# Patient Record
Sex: Female | Born: 1970
Health system: Southern US, Community
[De-identification: ages and names within clinical notes are randomized; demographics above are authoritative.]

## PROBLEM LIST (undated history)

## (undated) DIAGNOSIS — G4730 Sleep apnea, unspecified: Secondary | ICD-10-CM

## (undated) HISTORY — PX: CHOLECYSTECTOMY: SHX55

## (undated) HISTORY — DX: Sleep apnea, unspecified: G47.30

---

## 1998-03-26 ENCOUNTER — Other Ambulatory Visit: Admission: RE | Admit: 1998-03-26 | Discharge: 1998-03-26 | Payer: Self-pay | Admitting: Obstetrics and Gynecology

## 1998-10-17 ENCOUNTER — Inpatient Hospital Stay (HOSPITAL_COMMUNITY): Admission: AD | Admit: 1998-10-17 | Discharge: 1998-10-19 | Payer: Self-pay | Admitting: Obstetrics and Gynecology

## 1998-12-02 ENCOUNTER — Other Ambulatory Visit: Admission: RE | Admit: 1998-12-02 | Discharge: 1998-12-02 | Payer: Self-pay | Admitting: Obstetrics and Gynecology

## 1999-12-30 ENCOUNTER — Other Ambulatory Visit: Admission: RE | Admit: 1999-12-30 | Discharge: 1999-12-30 | Payer: Self-pay | Admitting: Obstetrics and Gynecology

## 2000-06-21 ENCOUNTER — Inpatient Hospital Stay (HOSPITAL_COMMUNITY): Admission: AD | Admit: 2000-06-21 | Discharge: 2000-06-21 | Payer: Self-pay | Admitting: Obstetrics and Gynecology

## 2000-07-15 ENCOUNTER — Inpatient Hospital Stay (HOSPITAL_COMMUNITY): Admission: AD | Admit: 2000-07-15 | Discharge: 2000-07-16 | Payer: Self-pay | Admitting: Obstetrics and Gynecology

## 2000-09-02 ENCOUNTER — Other Ambulatory Visit: Admission: RE | Admit: 2000-09-02 | Discharge: 2000-09-02 | Payer: Self-pay | Admitting: Obstetrics and Gynecology

## 2000-11-19 ENCOUNTER — Emergency Department (HOSPITAL_COMMUNITY): Admission: EM | Admit: 2000-11-19 | Discharge: 2000-11-19 | Payer: Self-pay | Admitting: Emergency Medicine

## 2000-11-30 ENCOUNTER — Ambulatory Visit (HOSPITAL_COMMUNITY): Admission: RE | Admit: 2000-11-30 | Discharge: 2000-11-30 | Payer: Self-pay | Admitting: Pulmonary Disease

## 2001-12-08 ENCOUNTER — Other Ambulatory Visit: Admission: RE | Admit: 2001-12-08 | Discharge: 2001-12-08 | Payer: Self-pay | Admitting: Obstetrics and Gynecology

## 2001-12-13 ENCOUNTER — Encounter: Admission: RE | Admit: 2001-12-13 | Discharge: 2001-12-13 | Payer: Self-pay | Admitting: Obstetrics and Gynecology

## 2001-12-13 ENCOUNTER — Encounter: Payer: Self-pay | Admitting: Obstetrics and Gynecology

## 2007-11-14 ENCOUNTER — Encounter: Admission: RE | Admit: 2007-11-14 | Discharge: 2007-11-14 | Payer: Self-pay | Admitting: Pulmonary Disease

## 2013-06-08 ENCOUNTER — Other Ambulatory Visit (HOSPITAL_COMMUNITY): Payer: Self-pay | Admitting: Internal Medicine

## 2013-06-08 DIAGNOSIS — Z139 Encounter for screening, unspecified: Secondary | ICD-10-CM

## 2013-06-15 ENCOUNTER — Ambulatory Visit (HOSPITAL_COMMUNITY)
Admission: RE | Admit: 2013-06-15 | Discharge: 2013-06-15 | Disposition: A | Discharge status: Home / Self Care | Payer: 59 | Source: Ambulatory Visit | Attending: Internal Medicine | Admitting: Internal Medicine

## 2013-06-15 DIAGNOSIS — Z1231 Encounter for screening mammogram for malignant neoplasm of breast: Secondary | ICD-10-CM | POA: Insufficient documentation

## 2013-06-15 DIAGNOSIS — Z139 Encounter for screening, unspecified: Secondary | ICD-10-CM

## 2014-05-31 ENCOUNTER — Other Ambulatory Visit: Payer: Self-pay

## 2014-05-31 DIAGNOSIS — Z1231 Encounter for screening mammogram for malignant neoplasm of breast: Secondary | ICD-10-CM

## 2014-06-21 ENCOUNTER — Ambulatory Visit
Admission: RE | Admit: 2014-06-21 | Discharge: 2014-06-21 | Disposition: A | Discharge status: Home / Self Care | Payer: 59 | Source: Ambulatory Visit

## 2014-06-21 ENCOUNTER — Encounter (INDEPENDENT_AMBULATORY_CARE_PROVIDER_SITE_OTHER): Payer: Self-pay

## 2014-06-21 DIAGNOSIS — Z1231 Encounter for screening mammogram for malignant neoplasm of breast: Secondary | ICD-10-CM

## 2015-05-14 ENCOUNTER — Encounter: Payer: Self-pay | Admitting: Family

## 2015-05-14 ENCOUNTER — Ambulatory Visit (INDEPENDENT_AMBULATORY_CARE_PROVIDER_SITE_OTHER): Payer: 59 | Admitting: Family

## 2015-05-14 VITALS — BP 128/76 | HR 81 | Temp 97.1°F | Ht 69.5 in | Wt 315.0 lb

## 2015-05-14 DIAGNOSIS — J0100 Acute maxillary sinusitis, unspecified: Secondary | ICD-10-CM | POA: Diagnosis not present

## 2015-05-14 MED ORDER — AMOXICILLIN-POT CLAVULANATE 875-125 MG PO TABS: 1.0000 | ORAL_TABLET | Freq: Two times a day (BID) | ORAL | Status: AC

## 2015-05-14 MED ORDER — FLUTICASONE PROPIONATE 50 MCG/ACT NA SUSP: 2.0000 | Freq: Every day | NASAL | Status: AC

## 2015-05-14 NOTE — Progress Notes (Signed)
Subjective:    Patient ID: Nicole Hatfield, female    DOB: 1970-07-31, 44 y.o.   MRN: [id number]  PT presents to the office today establish care with sinus pain/infection.  Cough The current episode started in the past 7 days. The problem has been waxing and waning. The problem occurs every few minutes. The cough is productive of purulent sputum. Associated symptoms include ear pain (left) and a sore throat. Pertinent negatives include no chills, headaches or shortness of breath. She has tried rest and OTC cough suppressant for the symptoms.  Sinus Problem This is a recurrent problem. The current episode started 1 to 4 weeks ago. The problem has been waxing and waning since onset. There has been no fever. Her pain is at a severity of 2/10. The pain is mild. Associated symptoms include congestion, coughing, ear pain (left), a hoarse voice, sinus pressure, sneezing and a sore throat. Pertinent negatives include no chills, headaches or shortness of breath. Past treatments include oral decongestants, lying down and acetaminophen. The treatment provided mild relief.      Review of Systems  Constitutional: Negative.  Negative for chills.  HENT: Positive for congestion, ear pain (left), hoarse voice, sinus pressure, sneezing and sore throat.   Eyes: Negative.   Respiratory: Positive for cough. Negative for shortness of breath.   Cardiovascular: Negative.  Negative for palpitations.  Gastrointestinal: Negative.   Endocrine: Negative.   Genitourinary: Negative.   Musculoskeletal: Negative.   Neurological: Negative.  Negative for headaches.  Hematological: Negative.   Psychiatric/Behavioral: Negative.   All other systems reviewed and are negative.      Objective:   Physical Exam  Constitutional: She is oriented to person, place, and time. She appears well-developed and well-nourished. No distress.  HENT:  Head: Normocephalic and atraumatic.  Right Ear: External ear normal.  Left Ear:  External ear normal.  Nose: Right sinus exhibits maxillary sinus tenderness. Left sinus exhibits maxillary sinus tenderness.  Nasal passage erythemas with mild swelling  Oropharynx erytheams  Eyes: Pupils are equal, round, and reactive to light.  Neck: Normal range of motion. Neck supple. No thyromegaly present.  Cardiovascular: Normal rate, regular rhythm, normal heart sounds and intact distal pulses.   No murmur heard. Pulmonary/Chest: Effort normal and breath sounds normal. No respiratory distress. She has no wheezes.  Abdominal: Soft. Bowel sounds are normal. She exhibits no distension. There is no tenderness.  Musculoskeletal: Normal range of motion. She exhibits no edema or tenderness.  Neurological: She is alert and oriented to person, place, and time. She has normal reflexes. No cranial nerve deficit.  Skin: Skin is warm and dry.  Psychiatric: She has a normal mood and affect. Her behavior is normal. Judgment and thought content normal.  Vitals reviewed.     BP 128/76 mmHg  Pulse 81  Temp(Src) 97.1 F (36.2 C) (Oral)  Ht 5' 9.5" (1.765 m)  Wt 315 lb (142.883 kg)  BMI 45.87 kg/m2     Assessment & Plan:  1. Acute maxillary sinusitis, recurrence not specified -- Take meds as prescribed - Use a cool mist humidifier  -Use saline nose sprays frequently -Saline irrigations of the nose can be very helpful if done frequently.  * 4X daily for 1 week*  * Use of a nettie pot can be helpful with this. Follow directions with this* -Force fluids -For any cough or congestion  Use plain Mucinex- regular strength or max strength is fine   * Children- consult with Pharmacist for dosing -  For fever or aces or pains- take tylenol or ibuprofen appropriate for age and weight.  * for fevers greater than 101 orally you may alternate ibuprofen and tylenol every  3 hours. -Throat lozenges if help - amoxicillin-clavulanate (AUGMENTIN) 875-125 MG tablet; Take 1 tablet by mouth 2 (two) times  daily.  Dispense: 14 tablet; Refill: 0  Jannifer Rodneyhristy Araminta Zorn, FNP

## 2015-05-14 NOTE — Patient Instructions (Signed)
Sinusitis, Adult Sinusitis is redness, soreness, and inflammation of the paranasal sinuses. Paranasal sinuses are air pockets within the bones of your face. They are located beneath your eyes, in the middle of your forehead, and above your eyes. In healthy paranasal sinuses, mucus is able to drain out, and air is able to circulate through them by way of your nose. However, when your paranasal sinuses are inflamed, mucus and air can become trapped. This can allow bacteria and other germs to grow and cause infection. Sinusitis can develop quickly and last only a short time (acute) or continue over a long period (chronic). Sinusitis that lasts for more than 12 weeks is considered chronic. CAUSES Causes of sinusitis include:  Allergies.  Structural abnormalities, such as displacement of the cartilage that separates your nostrils (deviated septum), which can decrease the air flow through your nose and sinuses and affect sinus drainage.  Functional abnormalities, such as when the small hairs (cilia) that line your sinuses and help remove mucus do not work properly or are not present. SIGNS AND SYMPTOMS Symptoms of acute and chronic sinusitis are the same. The primary symptoms are pain and pressure around the affected sinuses. Other symptoms include:  Upper toothache.  Earache.  Headache.  Bad breath.  Decreased sense of smell and taste.  A cough, which worsens when you are lying flat.  Fatigue.  Fever.  Thick drainage from your nose, which often is green and may contain pus (purulent).  Swelling and warmth over the affected sinuses. DIAGNOSIS Your health care provider will perform a physical exam. During your exam, your health care provider may perform any of the following to help determine if you have acute sinusitis or chronic sinusitis:  Look in your nose for signs of abnormal growths in your nostrils (nasal polyps).  Tap over the affected sinus to check for signs of  infection.  View the inside of your sinuses using an imaging device that has a light attached (endoscope). If your health care provider suspects that you have chronic sinusitis, one or more of the following tests may be recommended:  Allergy tests.  Nasal culture. A sample of mucus is taken from your nose, sent to a lab, and screened for bacteria.  Nasal cytology. A sample of mucus is taken from your nose and examined by your health care provider to determine if your sinusitis is related to an allergy. TREATMENT Most cases of acute sinusitis are related to a viral infection and will resolve on their own within 10 days. Sometimes, medicines are prescribed to help relieve symptoms of both acute and chronic sinusitis. These may include pain medicines, decongestants, nasal steroid sprays, or saline sprays. However, for sinusitis related to a bacterial infection, your health care provider will prescribe antibiotic medicines. These are medicines that will help kill the bacteria causing the infection. Rarely, sinusitis is caused by a fungal infection. In these cases, your health care provider will prescribe antifungal medicine. For some cases of chronic sinusitis, surgery is needed. Generally, these are cases in which sinusitis recurs more than 3 times per year, despite other treatments. HOME CARE INSTRUCTIONS  Drink plenty of water. Water helps thin the mucus so your sinuses can drain more easily.  Use a humidifier.  Inhale steam 3-4 times a day (for example, sit in the bathroom with the shower running).  Apply a warm, moist washcloth to your face 3-4 times a day, or as directed by your health care provider.  Use saline nasal sprays to help   moisten and clean your sinuses.  Take medicines only as directed by your health care provider.  If you were prescribed either an antibiotic or antifungal medicine, finish it all even if you start to feel better. SEEK IMMEDIATE MEDICAL CARE IF:  You have  increasing pain or severe headaches.  You have nausea, vomiting, or drowsiness.  You have swelling around your face.  You have vision problems.  You have a stiff neck.  You have difficulty breathing.   This information is not intended to replace advice given to you by your health care provider. Make sure you discuss any questions you have with your health care provider.   Document Released: 05/04/2005 Document Revised: 05/25/2014 Document Reviewed: 05/19/2011 [employer] Interactive Patient Education 2016 [employer]  - Take meds as prescribed - Use a cool mist humidifier  -Use saline nose sprays frequently -Saline irrigations of the nose can be very helpful if done frequently.  * 4X daily for 1 week*  * Use of a nettie pot can be helpful with this. Follow directions with this* -Force fluids -For any cough or congestion  Use plain Mucinex- regular strength or max strength is fine   * Children- consult with Pharmacist for dosing -For fever or aces or pains- take tylenol or ibuprofen appropriate for age and weight.  * for fevers greater than 101 orally you may alternate ibuprofen and tylenol every  3 hours. -Throat lozenges if help   Jahleah Mariscal, FNP   

## 2019-04-10 ENCOUNTER — Other Ambulatory Visit: Payer: Self-pay | Admitting: Obstetrics and Gynecology

## 2019-04-10 DIAGNOSIS — R928 Other abnormal and inconclusive findings on diagnostic imaging of breast: Secondary | ICD-10-CM

## 2019-04-19 ENCOUNTER — Ambulatory Visit
Admission: RE | Admit: 2019-04-19 | Discharge: 2019-04-19 | Disposition: A | Discharge status: Home / Self Care | Payer: 59 | Source: Ambulatory Visit | Attending: Obstetrics and Gynecology | Admitting: Obstetrics and Gynecology

## 2019-04-19 ENCOUNTER — Other Ambulatory Visit: Payer: Self-pay | Admitting: Obstetrics and Gynecology

## 2019-04-19 ENCOUNTER — Other Ambulatory Visit: Payer: Self-pay

## 2019-04-19 DIAGNOSIS — N6489 Other specified disorders of breast: Secondary | ICD-10-CM

## 2019-04-19 DIAGNOSIS — R928 Other abnormal and inconclusive findings on diagnostic imaging of breast: Secondary | ICD-10-CM

## 2019-10-20 ENCOUNTER — Other Ambulatory Visit: Payer: Self-pay | Admitting: Obstetrics and Gynecology

## 2019-10-20 ENCOUNTER — Ambulatory Visit
Admission: RE | Admit: 2019-10-20 | Discharge: 2019-10-20 | Disposition: A | Discharge status: Home / Self Care | Payer: 59 | Source: Ambulatory Visit | Attending: Obstetrics and Gynecology | Admitting: Obstetrics and Gynecology

## 2019-10-20 ENCOUNTER — Other Ambulatory Visit: Payer: Self-pay

## 2019-10-20 DIAGNOSIS — N6489 Other specified disorders of breast: Secondary | ICD-10-CM

## 2020-04-22 ENCOUNTER — Ambulatory Visit
Admission: RE | Admit: 2020-04-22 | Discharge: 2020-04-22 | Disposition: A | Discharge status: Home / Self Care | Payer: 59 | Source: Ambulatory Visit | Attending: Obstetrics and Gynecology | Admitting: Obstetrics and Gynecology

## 2020-04-22 ENCOUNTER — Other Ambulatory Visit: Payer: Self-pay

## 2020-04-22 DIAGNOSIS — N6489 Other specified disorders of breast: Secondary | ICD-10-CM

## 2021-03-26 ENCOUNTER — Other Ambulatory Visit: Payer: Self-pay | Admitting: Obstetrics and Gynecology

## 2021-03-26 DIAGNOSIS — Z87898 Personal history of other specified conditions: Secondary | ICD-10-CM

## 2021-04-25 ENCOUNTER — Ambulatory Visit
Admission: RE | Admit: 2021-04-25 | Discharge: 2021-04-25 | Disposition: A | Discharge status: Home / Self Care | Payer: 59 | Source: Ambulatory Visit | Attending: Obstetrics and Gynecology | Admitting: Obstetrics and Gynecology

## 2021-04-25 DIAGNOSIS — Z87898 Personal history of other specified conditions: Secondary | ICD-10-CM

## 2022-10-14 IMAGING — MG DIGITAL DIAGNOSTIC BILAT W/ TOMO W/ CAD
6 of 12 series · 6 of 36 positions shown · non-contrast
Comparison: Previous exam(s).

CLINICAL DATA: 50-year-old female for 2 year follow-up of LEFT
breast mass and for annual bilateral mammogram.

EXAM:
DIGITAL DIAGNOSTIC BILATERAL MAMMOGRAM WITH TOMOSYNTHESIS AND CAD;
ULTRASOUND LEFT BREAST LIMITED
TECHNIQUE: Bilateral digital diagnostic mammography and breast tomosynthesis
was performed. The images were evaluated with computer-aided
detection.; Targeted ultrasound examination of the left breast was
performed.

[R MLO synth-2D (1 of 2)]
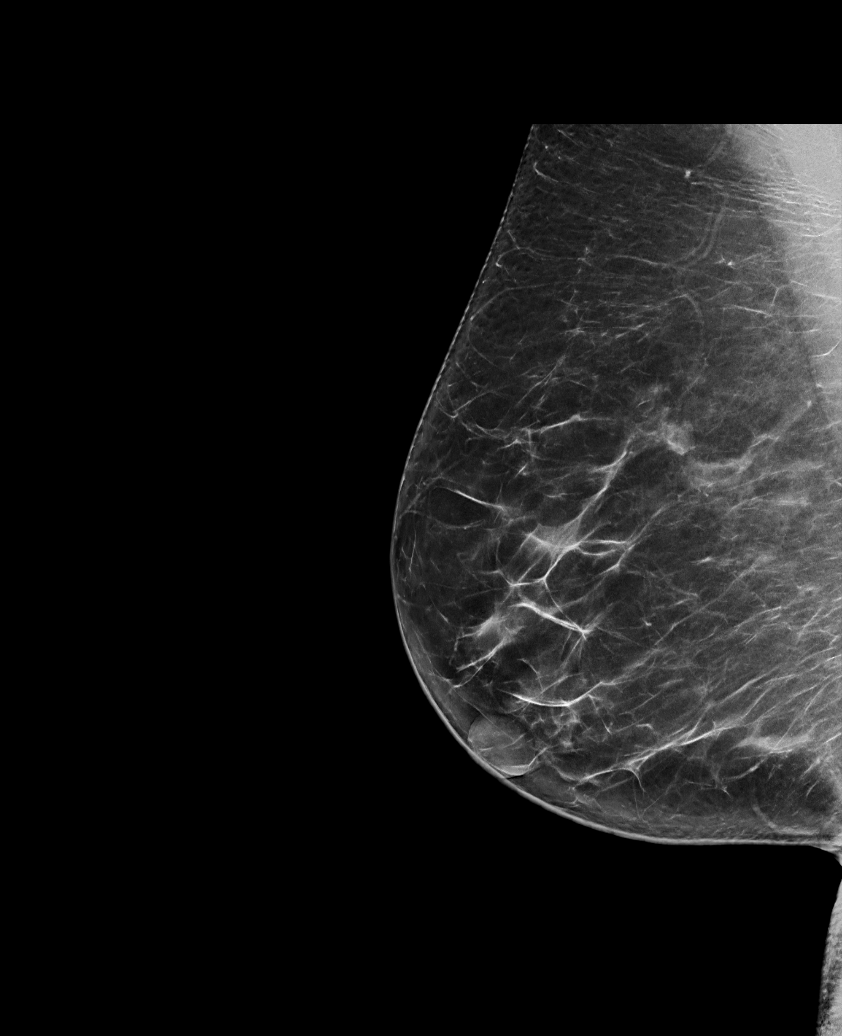

[R MLO synth-2D (2 of 2)]
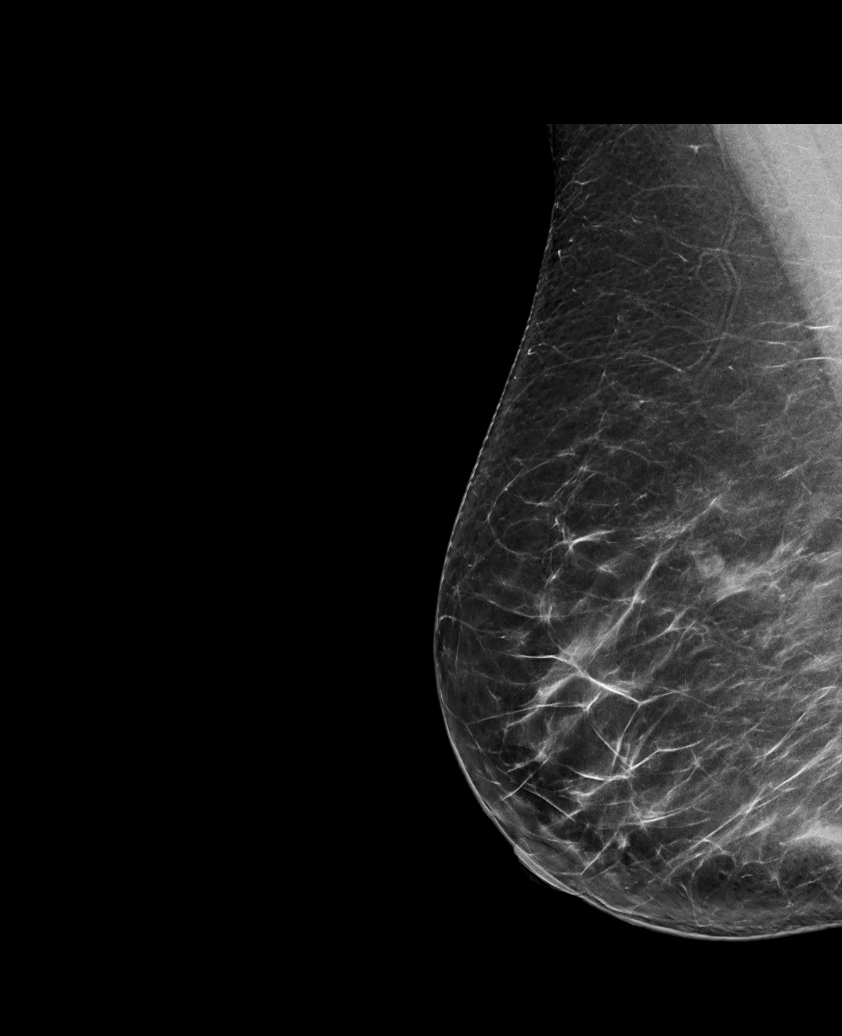

[L CC synth-2D]
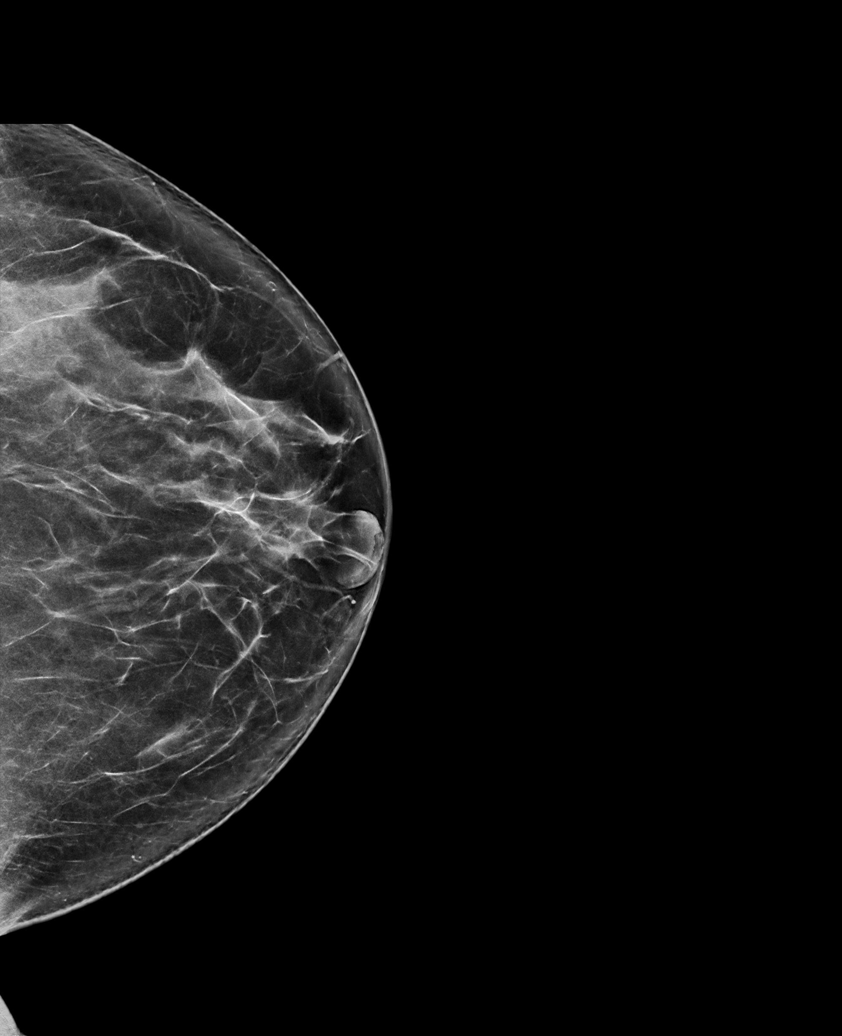

[L MLO synth-2D (1 of 2)]
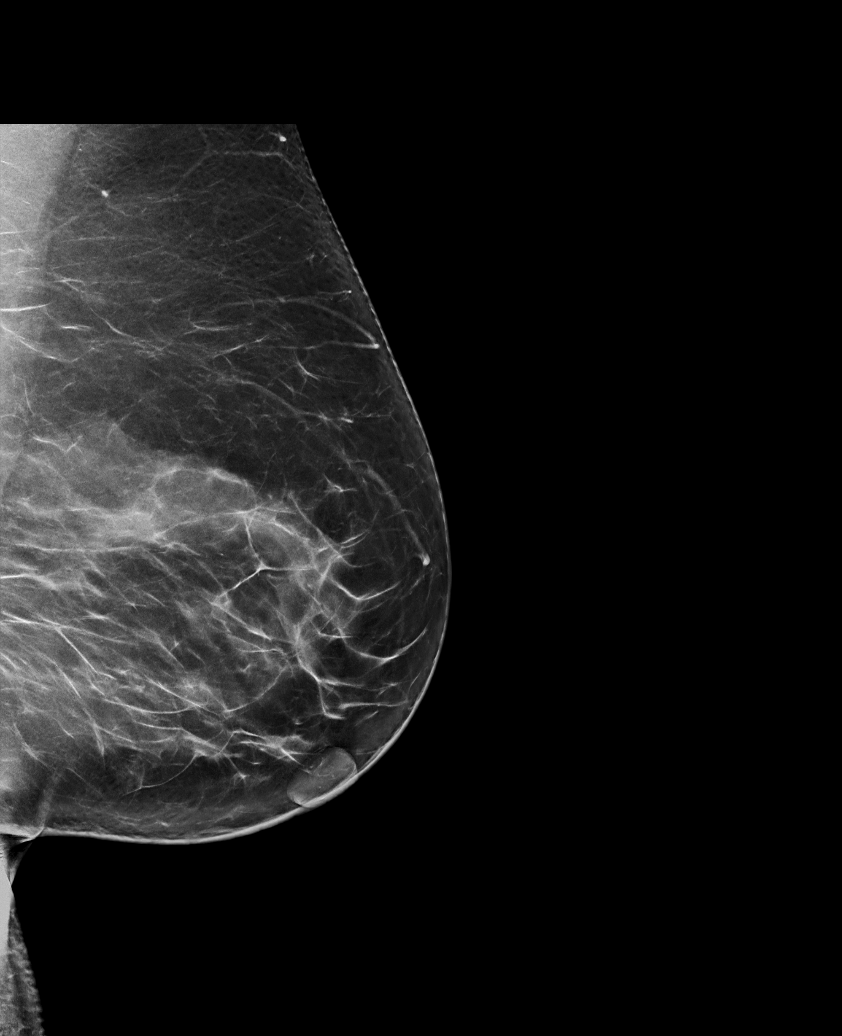

[L MLO synth-2D (2 of 2)]
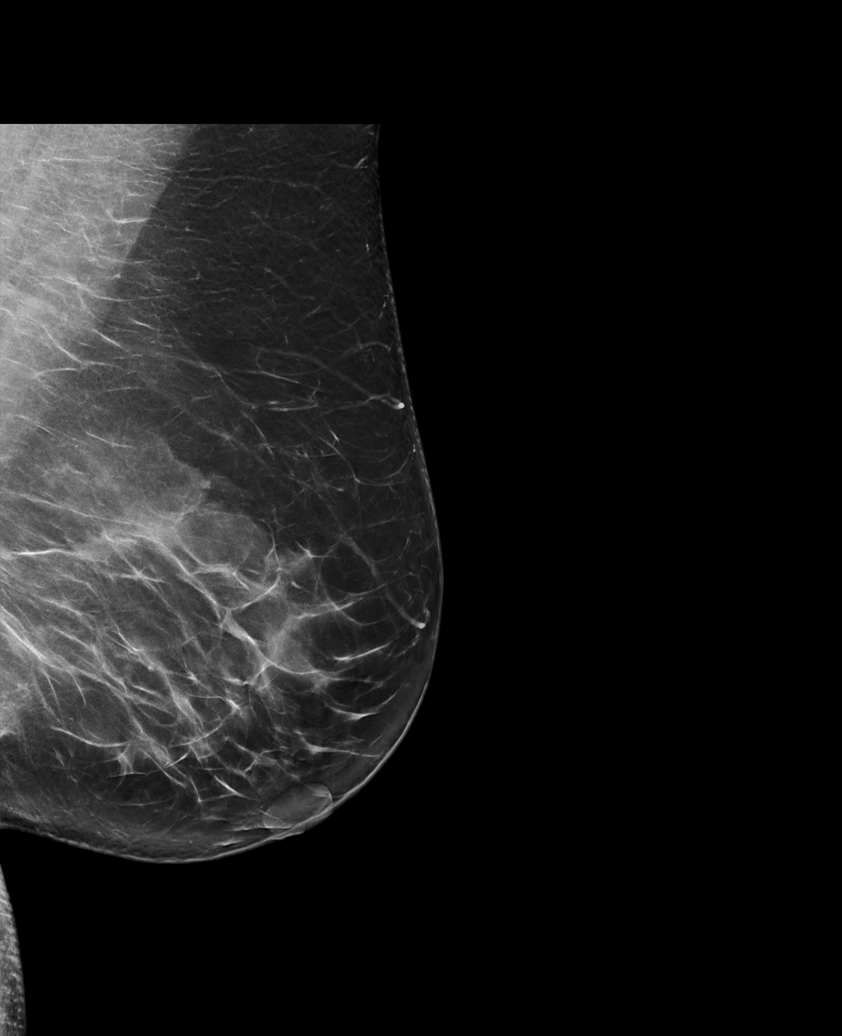

[R CC synth-2D]
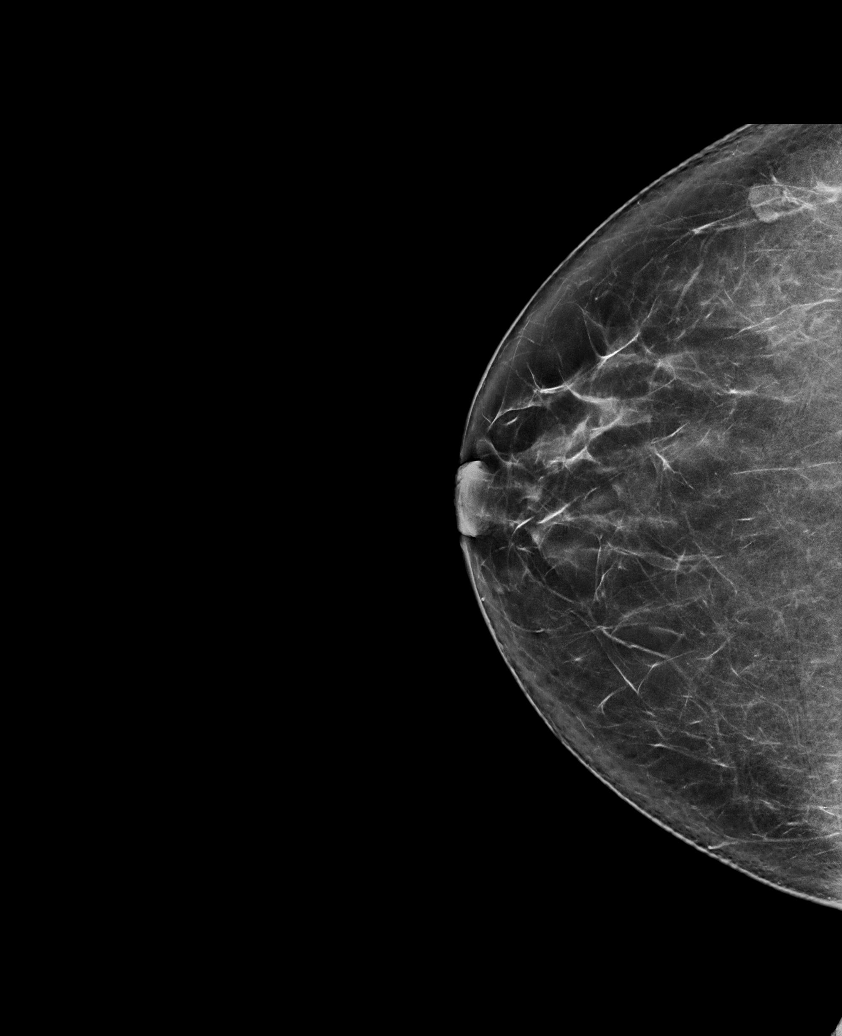

[6 of 36 positions shown; findings below may reference images not displayed]

ACR Breast Density Category b: There are scattered areas of
fibroglandular density.
FINDINGS: 2D/3D full field views of both breasts demonstrate a stable
circumscribed oval mass in the INNER LEFT breast.

No new or suspicious mammographic findings are noted within either
breast.

Targeted ultrasound is performed, showing a stable 1.1 x 0.1 x 1 cm
circumscribed oval hypoechoic parallel mass at the 10 o'clock
position of the LEFT breast 5 cm from the nipple.
IMPRESSION: 1. Stable 1.1 cm UPPER INNER LEFT breast mass, unchanged for 2 years
and considered benign.
2. No mammographic evidence of breast malignancy.

RECOMMENDATION:
Bilateral screening mammogram in 1 year.

I have discussed the findings and recommendations with the patient.
If applicable, a reminder letter will be sent to the patient
regarding the next appointment.

BI-RADS CATEGORY  2: Benign.

## 2022-10-14 IMAGING — US US BREAST*L* LIMITED INC AXILLA
1 series · 5 of 5 positions shown · non-contrast
Comparison: Previous exam(s).

CLINICAL DATA: 50-year-old female for 2 year follow-up of LEFT
breast mass and for annual bilateral mammogram.

EXAM:
DIGITAL DIAGNOSTIC BILATERAL MAMMOGRAM WITH TOMOSYNTHESIS AND CAD;
ULTRASOUND LEFT BREAST LIMITED
TECHNIQUE: Bilateral digital diagnostic mammography and breast tomosynthesis
was performed. The images were evaluated with computer-aided
detection.; Targeted ultrasound examination of the left breast was
performed.

[Series 1: us breast*left* limited inc axilla · 0.06mm/px · 5 of 5 slices shown]
[im 1/5]
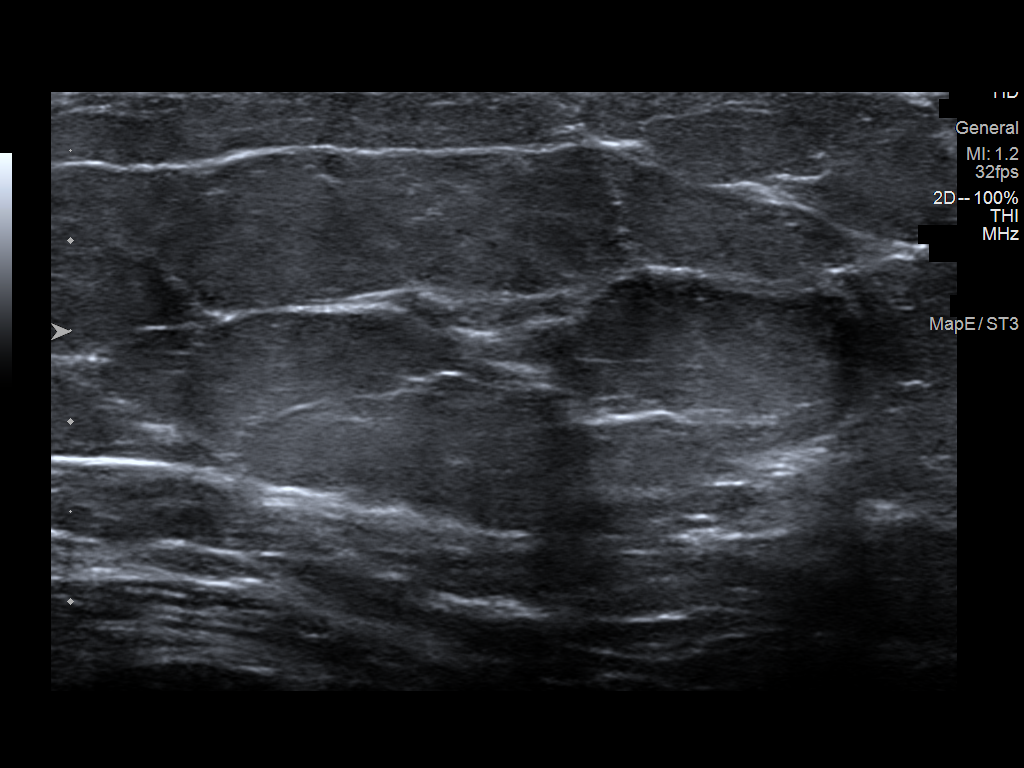
[im 2/5]
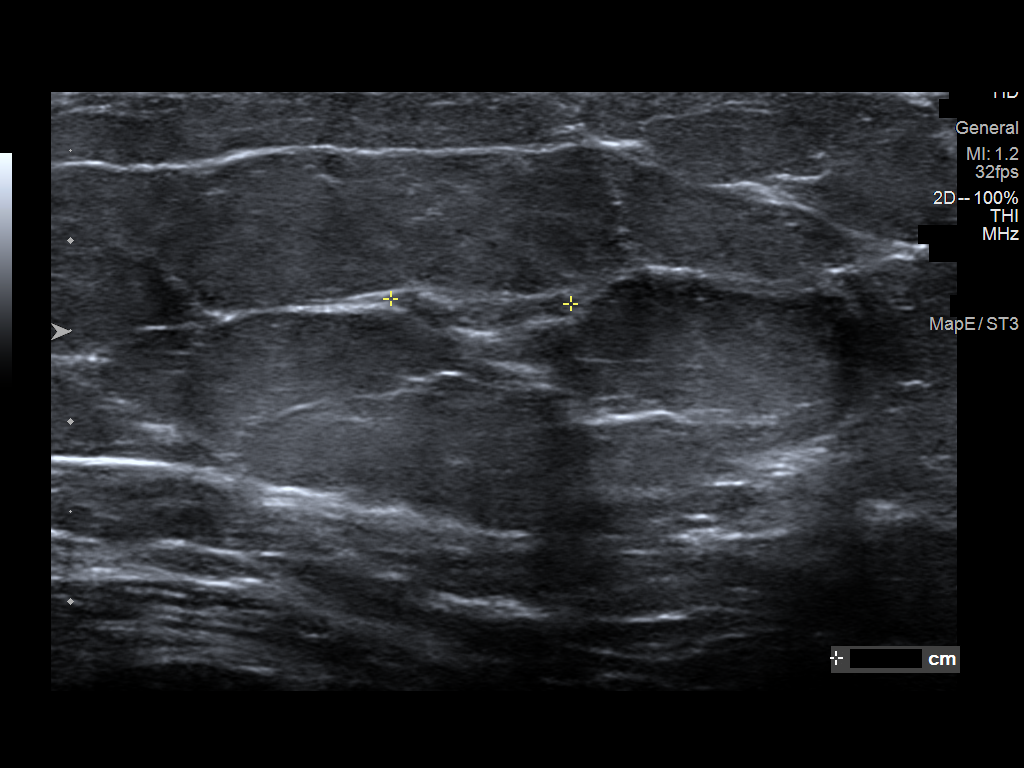
[im 3/5]
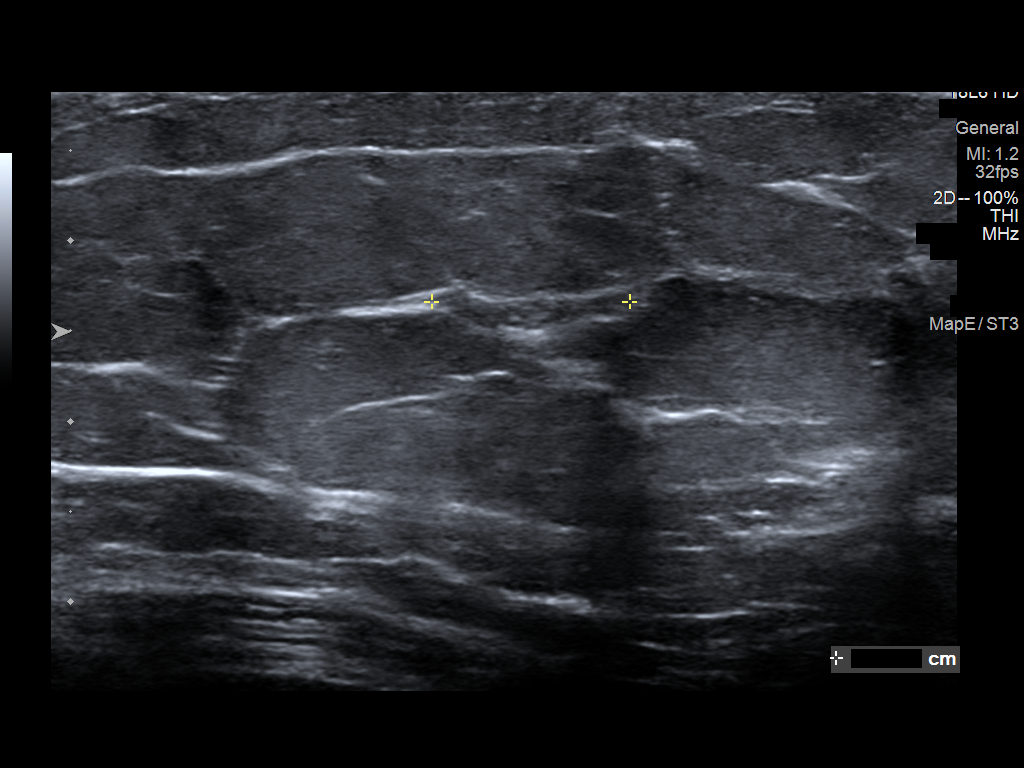
[im 4/5]
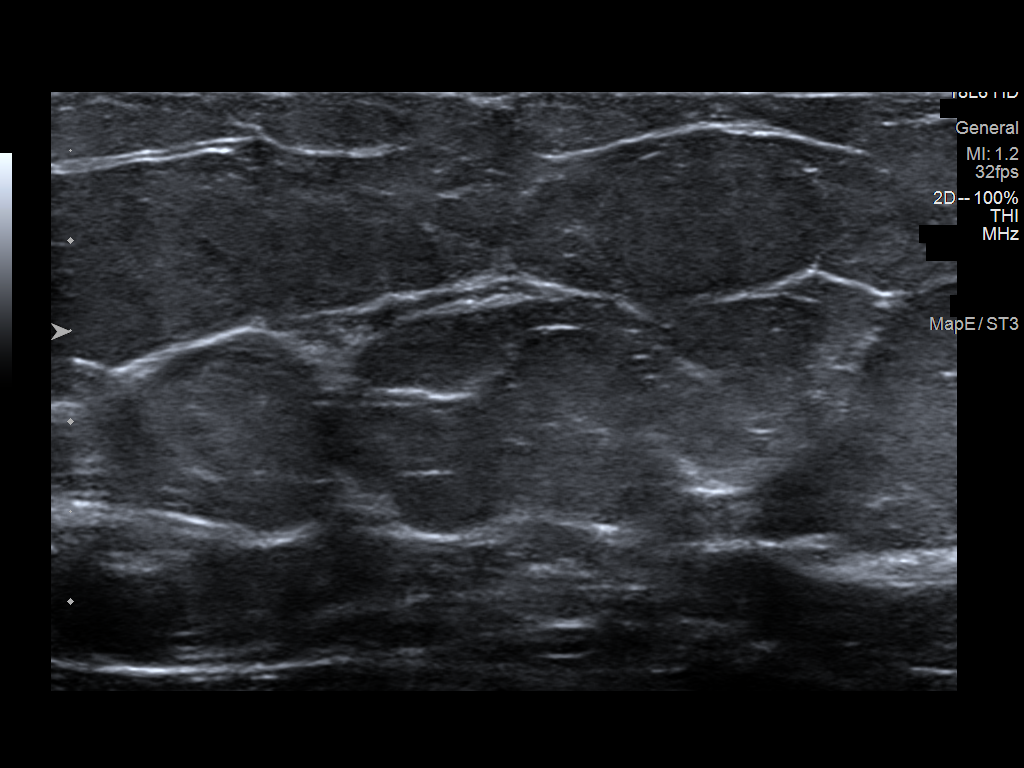
[im 5/5]
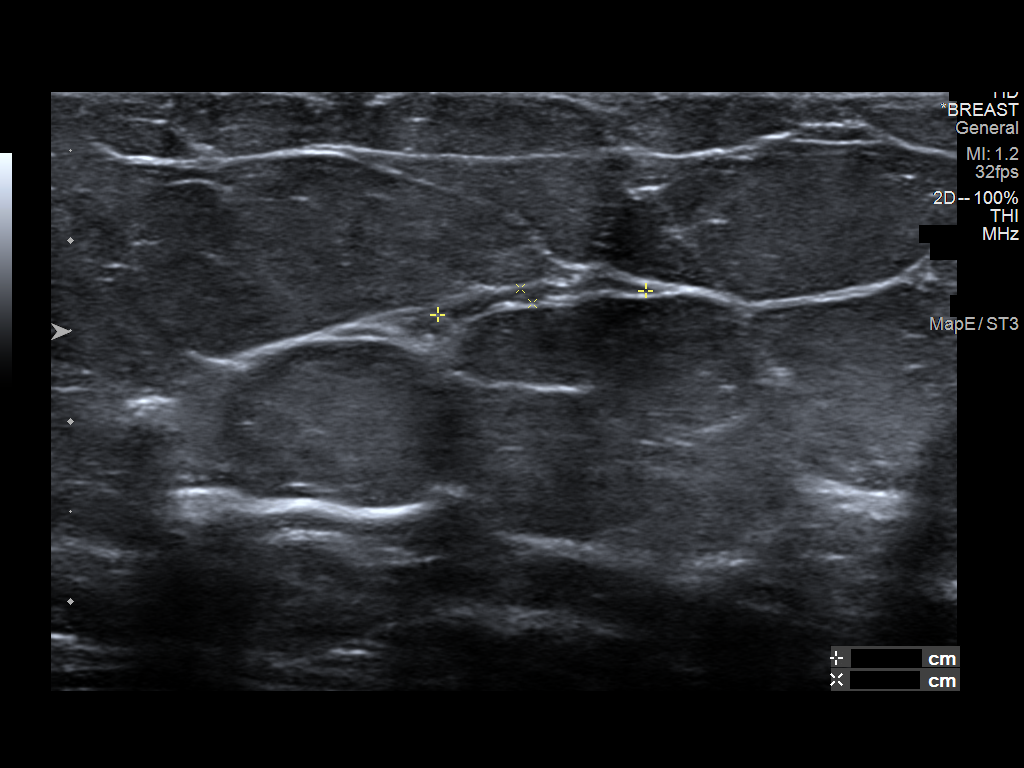

[5 of 5 positions shown; findings below may reference images not displayed]

ACR Breast Density Category b: There are scattered areas of
fibroglandular density.
FINDINGS: 2D/3D full field views of both breasts demonstrate a stable
circumscribed oval mass in the INNER LEFT breast.

No new or suspicious mammographic findings are noted within either
breast.

Targeted ultrasound is performed, showing a stable 1.1 x 0.1 x 1 cm
circumscribed oval hypoechoic parallel mass at the 10 o'clock
position of the LEFT breast 5 cm from the nipple.
IMPRESSION: 1. Stable 1.1 cm UPPER INNER LEFT breast mass, unchanged for 2 years
and considered benign.
2. No mammographic evidence of breast malignancy.

RECOMMENDATION:
Bilateral screening mammogram in 1 year.

I have discussed the findings and recommendations with the patient.
If applicable, a reminder letter will be sent to the patient
regarding the next appointment.

BI-RADS CATEGORY  2: Benign.
